# Patient Record
Sex: Male | Born: 1960 | Race: White | Hispanic: No | Marital: Married | Smoking: Never smoker
Health system: Southern US, Community
[De-identification: ages and names within clinical notes are randomized; demographics above are authoritative.]

## PROBLEM LIST (undated history)

## (undated) DIAGNOSIS — K429 Umbilical hernia without obstruction or gangrene: Secondary | ICD-10-CM

## (undated) DIAGNOSIS — Z8709 Personal history of other diseases of the respiratory system: Secondary | ICD-10-CM

## (undated) DIAGNOSIS — Z9189 Other specified personal risk factors, not elsewhere classified: Secondary | ICD-10-CM

## (undated) DIAGNOSIS — K219 Gastro-esophageal reflux disease without esophagitis: Secondary | ICD-10-CM

## (undated) DIAGNOSIS — Z87442 Personal history of urinary calculi: Secondary | ICD-10-CM

## (undated) DIAGNOSIS — N200 Calculus of kidney: Secondary | ICD-10-CM

## (undated) DIAGNOSIS — K4090 Unilateral inguinal hernia, without obstruction or gangrene, not specified as recurrent: Secondary | ICD-10-CM

## (undated) HISTORY — PX: TONSILLECTOMY: SUR1361

## (undated) HISTORY — PX: EXTRACORPOREAL SHOCK WAVE LITHOTRIPSY: SHX1557

---

## 2001-02-22 HISTORY — PX: KNEE ARTHROSCOPY: SUR90

## 2003-09-25 ENCOUNTER — Encounter: Admission: RE | Admit: 2003-09-25 | Discharge: 2003-09-25 | Payer: Self-pay | Admitting: Family Medicine

## 2003-11-20 ENCOUNTER — Encounter: Admission: RE | Admit: 2003-11-20 | Discharge: 2003-11-20 | Payer: Self-pay | Admitting: Otolaryngology

## 2005-08-11 ENCOUNTER — Encounter: Admission: RE | Admit: 2005-08-11 | Discharge: 2005-08-11 | Payer: Self-pay | Admitting: Family Medicine

## 2005-09-01 ENCOUNTER — Encounter: Admission: RE | Admit: 2005-09-01 | Discharge: 2005-09-01 | Payer: Self-pay | Admitting: Specialist

## 2007-06-22 ENCOUNTER — Ambulatory Visit (HOSPITAL_COMMUNITY): Admission: RE | Admit: 2007-06-22 | Discharge: 2007-06-22 | Payer: Self-pay | Admitting: Urology

## 2007-06-22 ENCOUNTER — Encounter: Payer: Self-pay | Admitting: Emergency Medicine

## 2007-06-22 HISTORY — PX: OTHER SURGICAL HISTORY: SHX169

## 2007-06-28 ENCOUNTER — Ambulatory Visit (HOSPITAL_COMMUNITY): Admission: AD | Admit: 2007-06-28 | Discharge: 2007-06-28 | Payer: Self-pay | Admitting: Urology

## 2007-06-28 HISTORY — PX: CYSTOSCOPY W/ URETERAL STENT PLACEMENT: SHX1429

## 2007-07-27 ENCOUNTER — Ambulatory Visit (HOSPITAL_COMMUNITY): Admission: RE | Admit: 2007-07-27 | Discharge: 2007-07-27 | Payer: Self-pay | Admitting: Urology

## 2008-04-26 ENCOUNTER — Encounter: Admission: RE | Admit: 2008-04-26 | Discharge: 2008-04-26 | Payer: Self-pay | Admitting: Otolaryngology

## 2010-03-15 ENCOUNTER — Encounter: Payer: Self-pay | Admitting: Urology

## 2010-07-07 NOTE — Op Note (Signed)
[name], [name]                  ACCOUNT NO.:  [id number]   MEDICAL RECORD NO.:  [id number]          PATIENT TYPE:  AMB   LOCATION:  DAY                          FACILITY:  [facility]   PHYSICIAN:  Lindaann Slough, M.D.  DATE OF BIRTH:  March 08, 1960   DATE OF PROCEDURE:  06/28/2007  DATE OF DISCHARGE:                               OPERATIVE REPORT   PREOPERATIVE DIAGNOSIS:  Left flank pain, status post left ureteral  stone extraction and left renal calculus.   POSTOPERATIVE DIAGNOSIS:  Left flank pain, status post left ureteral  stone extraction and left renal calculus.   PROCEDURE:  Cystoscopy, left retrograde pyelogram and insertion of  double-J stent.   SURGEON:  Danae Chen, M.D.   ANESTHESIA:  General.   INDICATIONS:  The patient is a 50 year old male who has a left ureteral  stone extraction on June 22, 2007.  A double-J stent was left in place  and removed yesterday morning.  Since then he has been having left flank  pain.  The pain was severe this morning and was associated with nausea.  A KUB showed a stone in the left kidney that has remained unchanged  since the CT scan of last week and there is no evidence of ureteral  stone.  However, the patient has continued to complain of pain, was  given IM analgesics that did not relieve the pain.  He is now scheduled  for cystoscopy and retrograde pyelogram and insertion of double-J stent.   DESCRIPTION OF PROCEDURE:  Under general anesthesia the patient was  prepped and draped and placed in the dorsal lithotomy position.  A  panendoscope was inserted in the bladder.  The urethra is normal.  There  is a blood clot at the left ureteral orifice.  There is no stone or  tumor in the bladder.   Retrograde pyelogram:   The Glidewire was passed through the cystoscope and into the left  ureteral orifice and the Glidewire was advanced all the way up into the  renal pelvis.  An open-ended catheter was then passed over the Glidewire  into the distal ureter and the Glidewire was removed.  Contrast was then  injected through the open-ended catheter.  There is some bullous edema  of the left distal ureter with moderate dilation of the distal and mid  ureter.  The proximal ureter and the renal pelvis and collecting system  appear nondilated.   A guidewire was then passed through the open-ended catheter and the open-  ended catheter was removed.  A #6-French - 26 double-J catheter was then  passed over the guidewire.  The proximal curl of the double-J catheter  is in  the renal pelvis.  The distal curl is in the bladder.  The bladder was  then emptied and the cystoscope and guidewire removed.   The patient tolerated the procedure well and left the OR in satisfactory  condition to post anesthesia care unit.      Lindaann Slough, M.D.  Electronically Signed     MN/MEDQ  D:  06/28/2007  T:  06/28/2007  Job:  [id number]

## 2010-07-07 NOTE — Op Note (Signed)
Charles Moran, Charles Moran                  ACCOUNT NO.:  [id number]   MEDICAL RECORD NO.:  [id number]          PATIENT TYPE:  AMB   LOCATION:  DAY                          FACILITY:  [facility]   PHYSICIAN:  Lindaann Slough, M.D.  DATE OF BIRTH:  Jul 09, 1960   DATE OF PROCEDURE:  06/22/2007  DATE OF DISCHARGE:                               OPERATIVE REPORT   PREOPERATIVE DIAGNOSIS:  Left ureteral stone.   POSTOPERATIVE DIAGNOSIS:  Left ureteral stone.   PROCEDURE:  Cystoscopy, left retrograde pyelogram, ureteroscopy, holmium  laser left ureteral stone, stone extraction and insertion of double-J  catheter.   SURGEON:  Danae Chen, M.D.   ANESTHESIA:  General.   INDICATION:  The patient is a 50 years old male who has a history of  kidney stones.  He had sudden onset of severe left flank pain this  morning associated with nausea and vomiting.  A CT scan showed a 3 x 4  mm stone in the left proximal ureter with mild hydronephrosis.  The  patient was treated with pain medication however, he continued to  complain of severe pain.  He is scheduled for cystoscopy, retrograde  pyelogram, stone manipulation and insertion of double-J catheter.   Under general anesthesia the patient was prepped and draped and placed  in the dorsolithotomy position.  A panendoscope was inserted in the  bladder.  The urethra is normal.  The bladder mucosa is normal.  There  is no stone or tumor in the bladder.  The ureteral orifices are in  normal position and shape.   RETROGRADE PYELOGRAM:  A Glidewire was passed through an open-ended  catheter and passed through the cystoscope into the left ureteral  orifice.  The Glidewire was advanced all the way up into the renal  pelvis and the open-ended catheter was advanced over the Glidewire into  the distal ureter.  The Glidewire was removed.  Contrast was then  injected through the open-ended catheter.  There is a filling defect in  the proximal ureter with mild dilation of  the proximal ureter and  collecting system.  A sensor tip guidewire was then passed through the  open-ended catheter and open-ended catheter was removed.  The cystoscope  was then removed.  A number 6.5 French semi rigid ureteroscope was then  passed in the bladder and through the ureteral orifice but could not be  passed through the intramural ureter.  The ureteroscope was then  removed.  The intramural ureter was then dilated with a ureteroscope  access sheath and the ureteroscope access sheath was removed.  The  ureteroscope was then reinserted in the bladder and passed without  difficulty in the ureter and it was advanced into the upper ureter where  the stone was visualized.  With a 365 microfiber holmium laser, the  stone was fragmented in multiple small stone fragments.  The larger  stone fragments were then removed with the nitinol stone basket.   SECOND RETROGRADE PYELOGRAM:  Contrast was injected through the  ureteroscope and there was no evidence of extravasation of contrast.  The ureteroscope was  then removed.   The guidewire was then back loaded into the cystoscope and a #6-French -  26 double-J catheter was passed over the guide wire.  The proximal curl  of the double-J catheter is in the renal pelvis.  The distal curl is in  the bladder.  The string was left attached to the double-J catheter.   Bladder was then emptied and the cystoscope and guidewire removed.   The patient tolerated the procedure well and left the OR in satisfactory  condition to post anesthesia care unit.      Lindaann Slough, M.D.  Electronically Signed     MN/MEDQ  D:  06/22/2007  T:  06/22/2007  Job:  [id number]

## 2010-11-17 LAB — URINALYSIS, ROUTINE W REFLEX MICROSCOPIC
Ketones, ur: 15 — AB
Nitrite: NEGATIVE
Specific Gravity, Urine: 1.018

## 2010-11-17 LAB — BASIC METABOLIC PANEL
CO2: 25
Chloride: 112
Glucose, Bld: 106 — ABNORMAL HIGH
Potassium: 5
Sodium: 140

## 2010-11-17 LAB — URINE MICROSCOPIC-ADD ON

## 2010-11-17 LAB — HEMOGLOBIN AND HEMATOCRIT, BLOOD: HCT: 39.6

## 2013-01-15 ENCOUNTER — Ambulatory Visit (INDEPENDENT_AMBULATORY_CARE_PROVIDER_SITE_OTHER): Payer: 59 | Admitting: Surgery

## 2013-01-17 ENCOUNTER — Encounter (INDEPENDENT_AMBULATORY_CARE_PROVIDER_SITE_OTHER): Payer: Self-pay | Admitting: Surgery

## 2013-10-12 ENCOUNTER — Encounter (INDEPENDENT_AMBULATORY_CARE_PROVIDER_SITE_OTHER): Payer: Self-pay | Admitting: Surgery

## 2013-10-12 ENCOUNTER — Ambulatory Visit (INDEPENDENT_AMBULATORY_CARE_PROVIDER_SITE_OTHER): Payer: 59 | Admitting: Surgery

## 2013-10-12 VITALS — BP 114/70 | HR 86 | Temp 98.4°F | Ht 71.0 in | Wt 210.1 lb

## 2013-10-12 DIAGNOSIS — K429 Umbilical hernia without obstruction or gangrene: Secondary | ICD-10-CM

## 2013-10-12 NOTE — Patient Instructions (Signed)
Umbilical Herniorrhaphy Herniorrhaphy is surgery to repair a hernia. A hernia is the protrusion of a part of an organ through an abdominal opening. An umbilical hernia means that your hernia is in the area around your navel. If the hernia is not repaired, the gap could get bigger. Your intestines or other tissues, such as fat, could get trapped in the gap. This can lead to other health problems, such as blocked intestines. If the hernia is fixed before problems set in, you may be allowed to go home the same day as the surgery (outpatient). LET YOUR HEALTH CARE PROVIDER KNOW ABOUT:  Allergies to food or medicine.  Medicines taken, including vitamins, herbs, eye drops, over-the-counter medicines, and creams.  Use of steroids (by mouth or creams).  Previous problems with anesthetics or numbing medicines.  History of bleeding problems or blood clots.  Previous surgery.  Other health problems, including diabetes and kidney problems.  Possibility of pregnancy, if this applies. RISKS AND COMPLICATIONS  Pain.  Excessive bleeding.  Hematoma. This is a pocket of blood that collects under the surgery site.  Infection at the surgery site.  Numbness at the surgery site.  Swelling and bruising.  Blood clots.  Intestinal damage (rare).  Scarring.  Skin damage.  Development of another hernia. This may require another surgery. BEFORE THE PROCEDURE  Ask your health care provider about changing or stopping your regular medicines. You may need to stop taking aspirin, nonsteroidal anti-inflammatory drugs (NSAIDs), vitamin E, and blood thinners as early as 2 weeks before the procedure.  Do not eat or drink for 8 hours before the procedure, or as directed by your health care provider.  You might be asked to shower or wash with an antibacterial soap before the procedure.  Wear comfortable clothes that will be easy to put on after the procedure. PROCEDURE You will be given an intravenous  (IV) tube. A needle will be inserted in your arm. Medicine will flow directly into your body through this needle. You might be given medicine to help you relax (sedative). You will be given medicine that numbs the area (local anesthetic) or medicine that makes you sleep (general anesthetic). If you have open surgery:  The surgeon will make a cut (incision) in your abdomen.  The gap in the muscle wall will be repaired. The surgeon may sew the edges together over the gap or use a mesh material to strengthen the area. When mesh is used, the body grows new, strong tissue into and around it. This new tissue closes the gap.  A drain might be put in to remove excess fluid from the body after surgery.  The surgeon will close the incision with stitches, glue, or staples. If you have laparoscopic surgery:  The surgeon will make several small incisions in your abdomen.  A thin, lighted tube (laparoscope) will be inserted into the abdomen through an incision. A camera is attached to the laparoscope that allows the surgeon to see inside the abdomen.  Tools will be inserted through the other incisions to repair the hernia. Usually, mesh is used to cover the gap.  The surgeon will close the incisions with stitches. AFTER THE PROCEDURE  You will be taken to a recovery area. A nurse will watch and check your progress.  When you are awake, feeling well, and taking fluids well, you may be allowed to go home. In some cases, you may need to stay overnight in the hospital.  Arrange for someone to drive you home.   Document Released: 05/07/2008 Document Revised: 06/25/2013 Document Reviewed: 05/12/2011 [employer] Patient Information 2015 [employer]. This information is not intended to replace advice given to you by your health care provider. Make sure you discuss any questions you have with your health care provider.  

## 2013-10-12 NOTE — Progress Notes (Signed)
Chief Complaint:  Symptomatic umbilical hernia also with a 10 year history of a left inguinal hernia  History of Present Illness:  Charles Moran is an 53 y.o. male who is referred by Dr. Marjory LiesBrent Burnett with a symptomatic umbilical hernia. This has gotten bigger over the last several months and bothers him more. There is no history of incarceration. He also has had a left inguinal hernia present for about 10 years. I discussed laparoscopic repair of the umbilical hernia since this is the one that is bothering him and he feels very comfortable dishwashing left inguinal hernia. I described the procedure in some detail and including its risks and benefits. A given a booklet on hernia repairs. He wants to wait until after December to have this repaired.  History reviewed. No pertinent past medical history.  Past Surgical History  Procedure Laterality Date  . Knee surgery      Current Outpatient Prescriptions  Medication Sig Dispense Refill  . cetirizine (ZYRTEC) 10 MG tablet Take 10 mg by mouth daily.       No current facility-administered medications for this visit.   Review of patient's allergies indicates no known allergies. Family History  Problem Relation Age of Onset  . Cancer Father    Social History:   reports that he has never smoked. He does not have any smokeless tobacco history on file. He reports that he drinks alcohol. He reports that he does not use illicit drugs.   REVIEW OF SYSTEMS : Negative except for see above  Physical Exam:   Blood pressure 114/70, pulse 86, temperature 98.4 F (36.9 C), temperature source Oral, height 5\' 11"  (1.803 m), weight 210 lb 2 oz (95.312 kg). Body mass index is 29.32 kg/(m^2).  Gen:  WDWN white male NAD  Neurological: Alert and oriented to person, place, and time. Motor and sensory function is grossly intact  Head: Normocephalic and atraumatic.  Eyes: Conjunctivae are normal. Pupils are equal, round, and reactive to light. No scleral icterus.   Neck: Normal range of motion. Neck supple. No tracheal deviation or thyromegaly present.  Cardiovascular:  SR without murmurs or gallops.  No carotid bruits Breast:  Not examined Respiratory: Effort normal.  No respiratory distress. No chest wall tenderness. Breath sounds normal.  No wheezes, rales or rhonchi.  Abdomen:  Prominent umbilical hernia protruding. Left inguinal hernia easily reducible and not entering into the scrotum. GU:  As above Musculoskeletal: Normal range of motion. Extremities are nontender. No cyanosis, edema or clubbing noted Lymphadenopathy: No cervical, preauricular, postauricular or axillary adenopathy is present Skin: Skin is warm and dry. No rash noted. No diaphoresis. No erythema. No pallor. Pscyh: Normal mood and affect. Behavior is normal. Judgment and thought content normal.   LABORATORY RESULTS: No results found for this or any previous visit (from the past 48 hour(s)).   RADIOLOGY RESULTS: No results found.  Problem List: There are no active problems to display for this patient.   Assessment & Plan: Symptomatic umbilical hernia. We'll schedule elective laparoscopic umbilical hernia repair in December as per patient request.    Matt B. Daphine DeutscherMartin, MD, [facility]  [facility] [phone] beeper [phone]  10/12/2013 5:36 PM

## 2013-10-16 ENCOUNTER — Encounter: Payer: Self-pay | Admitting: *Deleted

## 2013-10-17 ENCOUNTER — Encounter: Payer: Self-pay | Admitting: Neurology

## 2013-10-17 ENCOUNTER — Ambulatory Visit (INDEPENDENT_AMBULATORY_CARE_PROVIDER_SITE_OTHER): Payer: 59 | Admitting: Neurology

## 2013-10-17 VITALS — BP 115/72 | HR 72 | Ht 71.0 in | Wt 215.2 lb

## 2013-10-17 DIAGNOSIS — R202 Paresthesia of skin: Secondary | ICD-10-CM

## 2013-10-17 DIAGNOSIS — R7309 Other abnormal glucose: Secondary | ICD-10-CM

## 2013-10-17 DIAGNOSIS — M5412 Radiculopathy, cervical region: Secondary | ICD-10-CM

## 2013-10-17 DIAGNOSIS — G43909 Migraine, unspecified, not intractable, without status migrainosus: Secondary | ICD-10-CM

## 2013-10-17 DIAGNOSIS — R292 Abnormal reflex: Secondary | ICD-10-CM

## 2013-10-17 DIAGNOSIS — M5010 Cervical disc disorder with radiculopathy, unspecified cervical region: Secondary | ICD-10-CM

## 2013-10-17 DIAGNOSIS — R209 Unspecified disturbances of skin sensation: Secondary | ICD-10-CM

## 2013-10-17 NOTE — Patient Instructions (Signed)
Overall you are doing fairly well but I do want to suggest a few things today:   Remember to drink plenty of fluid, eat healthy meals and do not skip any meals. Try to eat protein with a every meal and eat a healthy snack such as fruit or nuts in between meals. Try to keep a regular sleep-wake schedule and try to exercise daily, particularly in the form of walking, 20-30 minutes a day, if you can.   As far as diagnostic testing: blood work today, MRI of your neck and upper back. Will consider further testing after results.   I would like to see you back in 3 months, sooner if we need to. Please call us with any interim questions, concerns, problems, updates or refill requests.   Please also call us for any test results so we can go over those with you on the phone.  My clinical assistant and will answer any of your questions and relay your messages to me and also relay most of my messages to you.   Our phone number is [phone]. We also have an after hours call service for urgent matters and there is a physician on-call for urgent questions. For any emergencies you know to call 911 or go to the nearest emergency room

## 2013-10-17 NOTE — Progress Notes (Addendum)
[facility]    Provider:  Dr Lucia Gaskins Referring Provider: Delorse Lek, MD Primary Care Physician:  No primary provider on file.  CC:  Left-sided numbness  HPI:  Charles Moran is a 53 y.o. male here as a referral from Dr. Doristine Counter for left-sided numbness  53 year old male with a PMHx of HTN, headache, myalgia, chronic sinusitis who is here for evaluation of left-sided numbness. The numbness is in the entire left side of the body: the face, tongue, lips, arm,leg torso. It goes right down the middle in the mouth and tongue and lip it also splits in the middle. No bad taste in the mouth, no sour tasting, no drooling from the left side of the mouth. Started approx 6 weeks ago. Doesn't remember where he was when he noticed it. Symptoms make him feel uncomfortable, no pain. Like when your arm goes to sleep, that's the way his body feels. No neck pain. Left side a little weaker with these episodes. Never had left sided weakness with the migraines. No other vision changes or other focal neurologic episodes. Both arms fall asleep at night. No numbness or tingling in the toes. No falls or any trauma or any inciting factors No headaches. Numbness is daily sometimes 5x a day, to 30 seconds. No headache association but does have a pmhx of migraines. Sometimes sees spots, like bubbles, out of the left side of his vision. Has had migraines for years since childhood but have improved since then. Headache yesterday but was more of a pressure headache. Migraine symptoms are throbbing left side of the face and top of the head +nausea, +vomiting. No autonomic symptoms. Never smoked. 1 beer a month. No toxin exposure. Current ymptoms may get worse in the heat. No rhyme or reasons to symptoms.   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   Review of Systems: Patient complains of symptoms per HPI as well as the following symptoms ringing in ears, dizziness. Pertinent negatives per HPI. Otherwise out of a complete 14 system review, and all other reviewed systems are negative.   History   Social History  . Marital Status: Married    Spouse Name: N/A    Number of Children: N/A  . Years of Education: N/A   Occupational History  . Not on file.   Social History Main Topics  . Smoking status: Never Smoker   . Smokeless tobacco: Not on file  . Alcohol Use: Yes  . Drug Use: No  . Sexual Activity: Not on file   Other Topics Concern  . Not on file   Social History Narrative  . No narrative on file    Family History  Problem Relation Age of Onset  . Cancer Father     Past Medical History  Diagnosis Date  . Elevated blood pressure reading without diagnosis of hypertension   . Headache   . Inguinal hernia without mention of obstruction or gangrene, unilateral or unspecified, (not specified as recurrent)   . Medial epicondylitis of elbow   . Myalgia and myositis, unspecified   . Unspecified sinusitis (chronic)   . Dermatophytosis of groin and perianal area   . Umbilical hernia without mention of obstruction or gangrene     Past Surgical History  Procedure Laterality Date  .  Knee surgery    . Tonsillectomy      Current Outpatient Prescriptions  Medication Sig Dispense Refill  . cetirizine (ZYRTEC) 10 MG tablet Take 10 mg by mouth daily.       No current facility-administered medications for this visit.    Allergies as of 10/17/2013  . (No Known Allergies)    Vitals: There were no vitals taken for this visit. Last Weight:  Wt Readings from Last 1 Encounters:  10/12/13 210 lb 2 oz (95.312 kg)   Last Height:   Ht Readings from Last 1 Encounters:  10/12/13  (1.803 m)     Physical exam: Exam: Gen: NAD,  conversant Eyes: anicteric sclerae, moist conjunctivae HENT: Atraumatic, oropharynx clear Neck: Trachea midline; supple,  Lungs: CTA, no wheezing, rales, rhonic                          CV: RRR, no MRG Abdomen: Soft, non-tender;  Extremities: No peripheral edema  Skin: Normal temperature,hypopigmentation on the arms Psych: Appropriate affect, pleasant    Neuro: Detailed Neurologic Exam  Speech:    Speech is normal; fluent and spontaneous with normal comprehension.  Cognition:    The patient is oriented to person, place, and time; memory intact; language fluent; normal attention, concentration, and fund of knowledge.  Cranial Nerves:    The pupils are equal, round, and reactive to light. The fundi are normal and spontaneous venous pulsations are present. Visual fields are full to finger confrontation. Extraocular movements are intact. Left hemisensory loss with splitting down the middle.  The face is symmetric. The palate elevates in the midline. Voice is normal. Shoulder shrug is normal. The tongue has normal motion without fasciculations.   Coordination:    Normal finger to nose and heel to shin.  Gait:    Heel-toe and tandem gait are normal.   Motor Observation:    No asymmetry, no atrophy, and no involuntary movements noted. Tone:    Normal muscle tone.Marland Kitchen    Posture:    Posture is normal. normal erect   Strength:    Strength is V/V in the upper and lower limbs.         Vibratory Sensation:    Normal vibratory sensation in upper and lower extremities.    Proprioception:    Normal proprioception in upper and lower extremities.   Pin Prick: Impaired lateral border of right leg. Left decrease distally in the lower extremity  Left T2 sensory level    Temperature: Impaired lateral border of right leg. Left decrease distally in the lower extremity     Reflex Exam:  DTR's:    Deep tendon reflexes in the upper and lower extremities are normal bilaterally.    Toes:   upgoing left Clonus:    Clonus is absent.  Other Reflexes: Negative glabellar      Assessment:  53 year old male with a PMHx of HTN, headache, myalgia, chronic sinusitis who is here for evaluation of left-sided numbness. The numbness is in the entire left side of the body: the face, tongue, lips, arm,leg torso. It goes right down the middle in the mouth and tongue and lip it also splits in the middle. Started approx 6 weeks ago. No neck pain. Nu current headache. No other vision changes or other focal neurologic episodes. 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   Neuro exam is unusual. Patient numbness of the face splits in the midline. And he appears to have a left T2 sensory level, upgoing left toe, and inconsistent lower left leg sensory findings. SYmptoms may be due to a migraine disorder because although he is not currently having migraines, he does report aura symptoms and a long history of migraines. But need to rule out other etiologies  T2 sensory level: Will order an MRI of the cervical spine and thoracic spine to ensure no cord lesion such as transverse myelitis causing sensory issues Paresthesias - Will order serum neuropathy panel Chronic microvascular disease - Discussed management of any vascular risks such as HTN, diabetes, hyperlipidemia and regular follow up with primary care physician.  Suggest follow up with optometrist for vision check and dilated fundoscopic exam    Naomie Dean, MD  [facility] [address] [place], [place] [address]  Phone [phone] Fax [phone] Lesly Dukes

## 2013-10-18 DIAGNOSIS — R292 Abnormal reflex: Secondary | ICD-10-CM | POA: Insufficient documentation

## 2013-10-18 DIAGNOSIS — G43909 Migraine, unspecified, not intractable, without status migrainosus: Secondary | ICD-10-CM | POA: Insufficient documentation

## 2013-10-18 DIAGNOSIS — M5010 Cervical disc disorder with radiculopathy, unspecified cervical region: Secondary | ICD-10-CM | POA: Insufficient documentation

## 2013-10-18 DIAGNOSIS — R202 Paresthesia of skin: Secondary | ICD-10-CM | POA: Insufficient documentation

## 2013-10-24 ENCOUNTER — Ambulatory Visit (INDEPENDENT_AMBULATORY_CARE_PROVIDER_SITE_OTHER): Payer: 59

## 2013-10-24 DIAGNOSIS — R292 Abnormal reflex: Secondary | ICD-10-CM

## 2013-10-24 DIAGNOSIS — M5412 Radiculopathy, cervical region: Secondary | ICD-10-CM

## 2013-10-24 DIAGNOSIS — R209 Unspecified disturbances of skin sensation: Secondary | ICD-10-CM

## 2013-10-24 DIAGNOSIS — R202 Paresthesia of skin: Secondary | ICD-10-CM

## 2013-10-24 MED ORDER — GADOPENTETATE DIMEGLUMINE 469.01 MG/ML IV SOLN: 20.0000 mL | Freq: Once | INTRAVENOUS | Status: AC | PRN

## 2013-10-26 LAB — COMPREHENSIVE METABOLIC PANEL
ALT: 11 IU/L (ref 0–44)
AST: 13 IU/L (ref 0–40)
Albumin/Globulin Ratio: 1.8 (ref 1.1–2.5)
Albumin: 4.3 g/dL (ref 3.5–5.5)
Alkaline Phosphatase: 67 IU/L (ref 39–117)
BILIRUBIN TOTAL: 1.8 mg/dL — AB (ref 0.0–1.2)
BUN/Creatinine Ratio: 16 (ref 9–20)
BUN: 20 mg/dL (ref 6–24)
CALCIUM: 9.5 mg/dL (ref 8.7–10.2)
CO2: 19 mmol/L (ref 18–29)
CREATININE: 1.25 mg/dL (ref 0.76–1.27)
Chloride: 104 mmol/L (ref 97–108)
GFR calc non Af Amer: 65 mL/min/{1.73_m2} (ref >59)
GFR, EST AFRICAN AMERICAN: 76 mL/min/{1.73_m2} (ref >59)
GLOBULIN, TOTAL: 2.4 g/dL (ref 1.5–4.5)
Glucose: 88 mg/dL (ref 65–99)
Potassium: 4.3 mmol/L (ref 3.5–5.2)
SODIUM: 140 mmol/L (ref 134–144)
Total Protein: 6.7 g/dL (ref 6.0–8.5)

## 2013-10-26 LAB — ANTINEUTROPHIL CYTOPLASMIC AB

## 2013-10-26 LAB — HEMOGLOBIN A1C
Est. average glucose Bld gHb Est-mCnc: 111 mg/dL
Hgb A1c MFr Bld: 5.5 % (ref 4.8–5.6)

## 2013-10-26 LAB — IFE AND PE, SERUM
ALPHA2 GLOB SERPL ELPH-MCNC: 0.8 g/dL (ref 0.4–1.2)
Albumin SerPl Elph-Mcnc: 3.7 g/dL (ref 3.2–5.6)
Albumin/Glob SerPl: 1.3 (ref 0.7–2.0)
Alpha 1: 0.2 g/dL (ref 0.1–0.4)
B-GLOBULIN SERPL ELPH-MCNC: 1.2 g/dL (ref 0.6–1.3)
GLOBULIN, TOTAL: 3 g/dL (ref 2.0–4.5)
Gamma Glob SerPl Elph-Mcnc: 0.8 g/dL (ref 0.5–1.6)
IGA/IMMUNOGLOBULIN A, SERUM: 284 mg/dL (ref 91–414)
IGG (IMMUNOGLOBIN G), SERUM: 787 mg/dL (ref 700–1600)
IgM (Immunoglobulin M), Srm: 126 mg/dL (ref 40–230)

## 2013-10-26 LAB — RPR: SYPHILIS RPR SCR: NONREACTIVE

## 2013-10-26 LAB — CRYOGLOBULIN, QL, SERUM, RFLX

## 2013-10-26 LAB — TSH: TSH: 1.75 u[IU]/mL (ref 0.450–4.500)

## 2013-10-26 LAB — LYME, TOTAL AB TEST/REFLEX

## 2013-10-26 LAB — SEDIMENTATION RATE: Sed Rate: 5 mm/hr (ref 0–30)

## 2013-10-26 LAB — HIV ANTIBODY (ROUTINE TESTING W REFLEX)
HIV 1/O/2 Abs-Index Value: <1 (ref <1.00)
HIV-1/HIV-2 Ab: NONREACTIVE

## 2013-10-26 LAB — ANTIMYELOPEROXIDASE (MPO) ABS

## 2013-10-26 LAB — VITAMIN B12: VITAMIN B 12: 384 pg/mL (ref 211–946)

## 2013-10-26 LAB — FOLATE: FOLATE: 12.9 ng/mL (ref >3.0)

## 2013-10-26 LAB — ANA W/REFLEX: Anti Nuclear Antibody(ANA): NEGATIVE

## 2013-10-26 LAB — VITAMIN B1, WHOLE BLOOD

## 2013-10-26 LAB — RHEUMATOID FACTOR: RHEUMATOID FACTOR: 9.5 [IU]/mL (ref 0.0–13.9)

## 2013-10-31 NOTE — Progress Notes (Signed)
Quick Note:  Called patient, left message of normal labs and MRI with some mild arthritic changes but nothing of significance, call the office back with any questions or concerns. ______

## 2014-02-04 ENCOUNTER — Encounter (HOSPITAL_BASED_OUTPATIENT_CLINIC_OR_DEPARTMENT_OTHER): Payer: Self-pay | Admitting: *Deleted

## 2014-02-08 ENCOUNTER — Encounter (HOSPITAL_BASED_OUTPATIENT_CLINIC_OR_DEPARTMENT_OTHER): Admission: RE | Payer: Self-pay | Source: Ambulatory Visit

## 2014-02-08 ENCOUNTER — Ambulatory Visit (HOSPITAL_BASED_OUTPATIENT_CLINIC_OR_DEPARTMENT_OTHER): Admission: RE | Admit: 2014-02-08 | Payer: 59 | Source: Ambulatory Visit | Admitting: Surgery

## 2014-02-08 HISTORY — DX: Personal history of urinary calculi: Z87.442

## 2014-02-08 HISTORY — DX: Personal history of other diseases of the respiratory system: Z87.09

## 2014-02-08 HISTORY — DX: Unilateral inguinal hernia, without obstruction or gangrene, not specified as recurrent: K40.90

## 2014-02-08 HISTORY — DX: Umbilical hernia without obstruction or gangrene: K42.9

## 2014-02-08 SURGERY — REPAIR, HERNIA, UMBILICAL, LAPAROSCOPIC
Anesthesia: General

## 2014-05-02 NOTE — Progress Notes (Signed)
Need pre-orders for pt on Tuesday 05-07-2014, Laparoscopic umbilical hernia repair.

## 2014-05-03 ENCOUNTER — Encounter (HOSPITAL_BASED_OUTPATIENT_CLINIC_OR_DEPARTMENT_OTHER): Payer: Self-pay | Admitting: *Deleted

## 2014-05-03 NOTE — Progress Notes (Signed)
NPO AFTER MN WITH EXCEPTION CLEAR LIQUIDS UNTIL 0700 (NO CREAM/ MILK PRODUCTS).  ARRIVE AT 1115. NEEDS HG.   PRE-OP ORDERS PENDING, CALL OFFICE YESTERDAY.

## 2014-05-03 NOTE — Progress Notes (Signed)
   05/03/14 1324  OBSTRUCTIVE SLEEP APNEA  Have you ever been diagnosed with sleep apnea through a sleep study? No  Do you snore loudly (loud enough to be heard through closed doors)?  1  Do you often feel tired, fatigued, or sleepy during the daytime? 0  Has anyone observed you stop breathing during your sleep? 0  Do you have, or are you being treated for high blood pressure? 0  BMI more than 35 kg/m2? 0  Age over 54 years old? 1  Neck circumference greater than 40 cm/16 inches? 1  Gender: 1  Obstructive Sleep Apnea Score 4

## 2014-05-06 ENCOUNTER — Ambulatory Visit (INDEPENDENT_AMBULATORY_CARE_PROVIDER_SITE_OTHER): Payer: Self-pay | Admitting: Orthopedic Surgery

## 2014-05-06 NOTE — H&P (Signed)
Chief Complaint: Symptomatic umbilical hernia also with a 10 year history of a left inguinal hernia  History of Present Illness: Charles Moran is an 54 y.o. male who is referred by Dr. Marjory LiesBrent Burnett with a symptomatic umbilical hernia. This has gotten bigger over the last several months and bothers him more. There is no history of incarceration. He also has had a left inguinal hernia present for about 10 years. I discussed laparoscopic repair of the umbilical hernia since this is the one that is bothering him and he feels very comfortable watching the  left inguinal hernia. I described the procedure in some detail and including its risks and benefits. A given a booklet on hernia repairs. History reviewed. No pertinent past medical history.  Past Surgical History  Procedure Laterality Date  . Knee surgery      Current Outpatient Prescriptions  Medication Sig Dispense Refill  . cetirizine (ZYRTEC) 10 MG tablet Take 10 mg by mouth daily.     No current facility-administered medications for this visit.   Review of patient's allergies indicates no known allergies. Family History  Problem Relation Age of Onset  . Cancer Father    Social History:  reports that he has never smoked. He does not have any smokeless tobacco history on file. He reports that he drinks alcohol. He reports that he does not use illicit drugs.   REVIEW OF SYSTEMS : Negative except for see above  Physical Exam:  Blood pressure 114/70, pulse 86, temperature 98.4 F (36.9 C), temperature source Oral, height 5\' 11"  (1.803 m), weight 210 lb 2 oz (95.312 kg). Body mass index is 29.32 kg/(m^2).  Gen: WDWN white male NAD  Neurological: Alert and oriented to person, place, and time. Motor and sensory function is grossly intact  Head: Normocephalic and atraumatic.  Eyes: Conjunctivae are normal. Pupils are equal, round, and reactive to light. No scleral icterus.  Neck: Normal range of  motion. Neck supple. No tracheal deviation or thyromegaly present.  Cardiovascular: SR without murmurs or gallops. No carotid bruits Breast: Not examined Respiratory: Effort normal. No respiratory distress. No chest wall tenderness. Breath sounds normal. No wheezes, rales or rhonchi.  Abdomen: Prominent umbilical hernia protruding. Left inguinal hernia easily reducible and not entering into the scrotum. GU: As above Musculoskeletal: Normal range of motion. Extremities are nontender. No cyanosis, edema or clubbing noted Lymphadenopathy: No cervical, preauricular, postauricular or axillary adenopathy is present Skin: Skin is warm and dry. No rash noted. No diaphoresis. No erythema. No pallor. Pscyh: Normal mood and affect. Behavior is normal. Judgment and thought content normal.   LABORATORY RESULTS:  Lab Results Last 48 Hours    No results found for this or any previous visit (from the past 48 hour(s)).     RADIOLOGY RESULTS:  Imaging Results (Last 48 hours)    No results found.    Problem List: Left inguinal hernia Umbilical hernia   Assessment & Plan: Symptomatic umbilical hernia. Laparoscopic umbilical hernia repair.     Matt B. Daphine DeutscherMartin, MD, [facility]  [facility] [phone] beeper [phone]

## 2014-05-07 ENCOUNTER — Encounter (HOSPITAL_BASED_OUTPATIENT_CLINIC_OR_DEPARTMENT_OTHER)
Admission: RE | Disposition: A | Discharge status: Home / Self Care | Payer: Self-pay | Source: Ambulatory Visit | Attending: Surgery

## 2014-05-07 ENCOUNTER — Ambulatory Visit (HOSPITAL_BASED_OUTPATIENT_CLINIC_OR_DEPARTMENT_OTHER)
Admission: RE | Admit: 2014-05-07 | Discharge: 2014-05-07 | Disposition: A | Discharge status: Home / Self Care | Payer: 59 | Source: Ambulatory Visit | Attending: Surgery | Admitting: Surgery

## 2014-05-07 ENCOUNTER — Ambulatory Visit (HOSPITAL_BASED_OUTPATIENT_CLINIC_OR_DEPARTMENT_OTHER): Payer: 59 | Admitting: Anesthesiology

## 2014-05-07 ENCOUNTER — Encounter (HOSPITAL_BASED_OUTPATIENT_CLINIC_OR_DEPARTMENT_OTHER): Payer: Self-pay | Admitting: Anesthesiology

## 2014-05-07 DIAGNOSIS — K429 Umbilical hernia without obstruction or gangrene: Secondary | ICD-10-CM | POA: Diagnosis present

## 2014-05-07 DIAGNOSIS — Z79899 Other long term (current) drug therapy: Secondary | ICD-10-CM | POA: Diagnosis not present

## 2014-05-07 HISTORY — DX: Other specified personal risk factors, not elsewhere classified: Z91.89

## 2014-05-07 HISTORY — DX: Calculus of kidney: N20.0

## 2014-05-07 HISTORY — DX: Gastro-esophageal reflux disease without esophagitis: K21.9

## 2014-05-07 HISTORY — PX: UMBILICAL HERNIA REPAIR: SHX196

## 2014-05-07 LAB — COMPREHENSIVE METABOLIC PANEL
ALT: 21 U/L (ref 0–53)
AST: 17 U/L (ref 0–37)
Albumin: 3.6 g/dL (ref 3.5–5.2)
Alkaline Phosphatase: 59 U/L (ref 39–117)
Anion gap: 1 — ABNORMAL LOW (ref 5–15)
BUN: 16 mg/dL (ref 6–23)
CALCIUM: 8.6 mg/dL (ref 8.4–10.5)
CO2: 27 mmol/L (ref 19–32)
CREATININE: 1.37 mg/dL — AB (ref 0.50–1.35)
Chloride: 110 mmol/L (ref 96–112)
GFR, EST AFRICAN AMERICAN: 67 mL/min — AB (ref >90)
GFR, EST NON AFRICAN AMERICAN: 57 mL/min — AB (ref >90)
GLUCOSE: 109 mg/dL — AB (ref 70–99)
Potassium: 4.5 mmol/L (ref 3.5–5.1)
Sodium: 138 mmol/L (ref 135–145)
Total Bilirubin: 2.1 mg/dL — ABNORMAL HIGH (ref 0.3–1.2)
Total Protein: 6.5 g/dL (ref 6.0–8.3)

## 2014-05-07 LAB — POCT HEMOGLOBIN-HEMACUE
HEMOGLOBIN: 15.9 g/dL (ref 13.0–17.0)
Hemoglobin: 4.6 g/dL — CL (ref 13.0–17.0)

## 2014-05-07 SURGERY — REPAIR, HERNIA, UMBILICAL, LAPAROSCOPIC
Anesthesia: General | Site: Abdomen

## 2014-05-07 MED ORDER — GLYCOPYRROLATE 0.2 MG/ML IJ SOLN
INTRAMUSCULAR | Status: DC | PRN
  Administered 2014-05-07: 0.4 mg via INTRAVENOUS
  Administered 2014-05-07: 0.2 mg via INTRAVENOUS

## 2014-05-07 MED ORDER — DEXTROSE 5 % IV SOLN
2.0000 g | INTRAVENOUS | Status: AC
  Administered 2014-05-07: 2 g via INTRAVENOUS
  Filled 2014-05-07: qty 2

## 2014-05-07 MED ORDER — CEFOXITIN SODIUM 2 G IV SOLR
INTRAVENOUS | Status: AC
  Filled 2014-05-07: qty 2

## 2014-05-07 MED ORDER — ACETAMINOPHEN 10 MG/ML IV SOLN
INTRAVENOUS | Status: DC | PRN
  Administered 2014-05-07: 1000 mg via INTRAVENOUS

## 2014-05-07 MED ORDER — PROMETHAZINE HCL 25 MG/ML IJ SOLN
6.2500 mg | INTRAMUSCULAR | Status: DC | PRN
  Filled 2014-05-07: qty 1

## 2014-05-07 MED ORDER — METOCLOPRAMIDE HCL 5 MG/ML IJ SOLN
INTRAMUSCULAR | Status: DC | PRN
  Administered 2014-05-07: 10 mg via INTRAVENOUS

## 2014-05-07 MED ORDER — DEXAMETHASONE SODIUM PHOSPHATE 4 MG/ML IJ SOLN
INTRAMUSCULAR | Status: DC | PRN
  Administered 2014-05-07: 10 mg via INTRAVENOUS

## 2014-05-07 MED ORDER — SODIUM CHLORIDE 0.9 % IJ SOLN
3.0000 mL | INTRAMUSCULAR | Status: DC | PRN
  Filled 2014-05-07: qty 3

## 2014-05-07 MED ORDER — MIDAZOLAM HCL 2 MG/2ML IJ SOLN
INTRAMUSCULAR | Status: AC
  Filled 2014-05-07: qty 2

## 2014-05-07 MED ORDER — FENTANYL CITRATE 0.05 MG/ML IJ SOLN
INTRAMUSCULAR | Status: AC
  Filled 2014-05-07: qty 2

## 2014-05-07 MED ORDER — KETOROLAC TROMETHAMINE 30 MG/ML IJ SOLN
30.0000 mg | Freq: Once | INTRAMUSCULAR | Status: DC | PRN
  Filled 2014-05-07: qty 1

## 2014-05-07 MED ORDER — LACTATED RINGERS IR SOLN
Status: DC | PRN
  Administered 2014-05-07: 3000 mL

## 2014-05-07 MED ORDER — HEPARIN SODIUM (PORCINE) 5000 UNIT/ML IJ SOLN
INTRAMUSCULAR | Status: AC
  Filled 2014-05-07: qty 1

## 2014-05-07 MED ORDER — LIDOCAINE HCL (CARDIAC) 20 MG/ML IV SOLN
INTRAVENOUS | Status: DC | PRN
  Administered 2014-05-07: 100 mg via INTRAVENOUS

## 2014-05-07 MED ORDER — ACETAMINOPHEN 325 MG PO TABS
650.0000 mg | ORAL_TABLET | ORAL | Status: DC | PRN
  Filled 2014-05-07: qty 2

## 2014-05-07 MED ORDER — NEOSTIGMINE METHYLSULFATE 10 MG/10ML IV SOLN
INTRAVENOUS | Status: DC | PRN
  Administered 2014-05-07: 4 mg via INTRAVENOUS

## 2014-05-07 MED ORDER — PROPOFOL 10 MG/ML IV BOLUS
INTRAVENOUS | Status: DC | PRN
  Administered 2014-05-07: 180 mg via INTRAVENOUS

## 2014-05-07 MED ORDER — OXYCODONE HCL 5 MG PO TABS
5.0000 mg | ORAL_TABLET | ORAL | Status: DC | PRN
  Administered 2014-05-07: 5 mg via ORAL
  Filled 2014-05-07: qty 2

## 2014-05-07 MED ORDER — ROCURONIUM BROMIDE 100 MG/10ML IV SOLN
INTRAVENOUS | Status: DC | PRN
  Administered 2014-05-07: 20 mg via INTRAVENOUS
  Administered 2014-05-07: 10 mg via INTRAVENOUS
  Administered 2014-05-07: 20 mg via INTRAVENOUS

## 2014-05-07 MED ORDER — ONDANSETRON HCL 4 MG/2ML IJ SOLN
INTRAMUSCULAR | Status: DC | PRN
  Administered 2014-05-07: 4 mg via INTRAVENOUS

## 2014-05-07 MED ORDER — FENTANYL CITRATE 0.05 MG/ML IJ SOLN
INTRAMUSCULAR | Status: AC
  Filled 2014-05-07: qty 4

## 2014-05-07 MED ORDER — MIDAZOLAM HCL 5 MG/5ML IJ SOLN
INTRAMUSCULAR | Status: DC | PRN
  Administered 2014-05-07: 2 mg via INTRAVENOUS

## 2014-05-07 MED ORDER — LACTATED RINGERS IV SOLN
INTRAVENOUS | Status: DC
  Administered 2014-05-07 (×2): via INTRAVENOUS
  Filled 2014-05-07: qty 1000

## 2014-05-07 MED ORDER — SODIUM CHLORIDE 0.9 % IR SOLN
Status: DC | PRN
  Administered 2014-05-07: 500 mL

## 2014-05-07 MED ORDER — SODIUM CHLORIDE 0.9 % IV SOLN
250.0000 mL | INTRAVENOUS | Status: DC | PRN
  Filled 2014-05-07: qty 250

## 2014-05-07 MED ORDER — OXYCODONE HCL 5 MG PO TABS
ORAL_TABLET | ORAL | Status: AC
  Filled 2014-05-07: qty 1

## 2014-05-07 MED ORDER — SUCCINYLCHOLINE CHLORIDE 20 MG/ML IJ SOLN
INTRAMUSCULAR | Status: DC | PRN
  Administered 2014-05-07: 100 mg via INTRAVENOUS

## 2014-05-07 MED ORDER — FENTANYL CITRATE 0.05 MG/ML IJ SOLN
25.0000 ug | INTRAMUSCULAR | Status: DC | PRN
  Administered 2014-05-07 (×2): 25 ug via INTRAVENOUS
  Filled 2014-05-07: qty 1

## 2014-05-07 MED ORDER — SODIUM CHLORIDE 0.9 % IJ SOLN
3.0000 mL | Freq: Two times a day (BID) | INTRAMUSCULAR | Status: DC
  Filled 2014-05-07: qty 3

## 2014-05-07 MED ORDER — ACETAMINOPHEN 650 MG RE SUPP
650.0000 mg | RECTAL | Status: DC | PRN
  Filled 2014-05-07: qty 1

## 2014-05-07 MED ORDER — FENTANYL CITRATE 0.05 MG/ML IJ SOLN
INTRAMUSCULAR | Status: DC | PRN
  Administered 2014-05-07 (×3): 25 ug via INTRAVENOUS
  Administered 2014-05-07: 100 ug via INTRAVENOUS
  Administered 2014-05-07: 50 ug via INTRAVENOUS
  Administered 2014-05-07: 25 ug via INTRAVENOUS
  Administered 2014-05-07: 50 ug via INTRAVENOUS

## 2014-05-07 MED ORDER — HEPARIN SODIUM (PORCINE) 5000 UNIT/ML IJ SOLN
5000.0000 [IU] | Freq: Once | INTRAMUSCULAR | Status: AC
  Administered 2014-05-07: 5000 [IU] via SUBCUTANEOUS
  Filled 2014-05-07: qty 1

## 2014-05-07 MED ORDER — BUPIVACAINE LIPOSOME 1.3 % IJ SUSP
INTRAMUSCULAR | Status: DC | PRN
  Administered 2014-05-07: 20 mL

## 2014-05-07 MED ORDER — OXYCODONE HCL 5 MG PO TABS: 5.0000 mg | ORAL_TABLET | ORAL | Status: AC | PRN

## 2014-05-07 SURGICAL SUPPLY — 43 items
BALL CTTN LRG ABS STRL LF (GAUZE/BANDAGES/DRESSINGS) ×1
BINDER ABDOMINAL 10 UNV 27-48 (MISCELLANEOUS) ×3 IMPLANT
CABLE HIGH FREQUENCY MONO STRZ (ELECTRODE) ×3 IMPLANT
COTTONBALL LRG STERILE PKG (GAUZE/BANDAGES/DRESSINGS) ×3 IMPLANT
COVER LIGHT HANDLE  1/PK (MISCELLANEOUS) ×4
COVER LIGHT HANDLE 1/PK (MISCELLANEOUS) ×2 IMPLANT
DEVICE RELIATACK FIXATION (MISCELLANEOUS) ×3 IMPLANT
DEVICE TROCAR PUNCTURE CLOSURE (ENDOMECHANICALS) ×3 IMPLANT
DRAPE CAMERA CLOSED 9X96 (DRAPES) ×3 IMPLANT
DRAPE LAPAROSCOPIC ABDOMINAL (DRAPES) ×3 IMPLANT
ELECT REM PT RETURN 9FT ADLT (ELECTROSURGICAL) ×3
ELECTRODE REM PT RTRN 9FT ADLT (ELECTROSURGICAL) ×1 IMPLANT
GLOVE BIO SURGEON STRL SZ 6.5 (GLOVE) ×4 IMPLANT
GLOVE BIO SURGEONS STRL SZ 6.5 (GLOVE) ×2
GLOVE BIOGEL M 8.0 STRL (GLOVE) ×3 IMPLANT
GLOVE BIOGEL PI IND STRL 6.5 (GLOVE) ×2 IMPLANT
GLOVE BIOGEL PI IND STRL 7.5 (GLOVE) ×3 IMPLANT
GLOVE BIOGEL PI INDICATOR 6.5 (GLOVE) ×4
GLOVE BIOGEL PI INDICATOR 7.5 (GLOVE) ×6
GOWN STRL REUS W/ TWL XL LVL3 (GOWN DISPOSABLE) ×2 IMPLANT
GOWN STRL REUS W/TWL XL LVL3 (GOWN DISPOSABLE) ×12 IMPLANT
IV LACTATED RINGER IRRG 3000ML (IV SOLUTION) ×3
IV LR IRRIG 3000ML ARTHROMATIC (IV SOLUTION) ×1 IMPLANT
KII OPTICAL ACCESS SYSTEM 5X100 IMPLANT
KIT BASIN OR (CUSTOM PROCEDURE TRAY) ×3 IMPLANT
LIQUID BAND (GAUZE/BANDAGES/DRESSINGS) ×3 IMPLANT
MARKER SKIN DUAL TIP RULER LAB (MISCELLANEOUS) ×3 IMPLANT
MESH PARIETEX 4.7 (Mesh General) ×3 IMPLANT
NS IRRIG 500ML POUR BTL (IV SOLUTION) ×3 IMPLANT
PENCIL BUTTON HOLSTER BLD 10FT (ELECTRODE) ×3 IMPLANT
RELIATACK ARTICULATING RELOADABLE FIXATION DEVICE IMPLANT
SCISSORS LAP 5X35 DISP (ENDOMECHANICALS) ×3 IMPLANT
SOLUTION ANTI FOG 6CC (MISCELLANEOUS) ×3 IMPLANT
STORZ HIGH FREQUENCY MONOPOLAR CABLE IMPLANT
SUT NOVA 0 T19/GS 22DT (SUTURE) ×6 IMPLANT
SUT VIC AB 4-0 SH 18 (SUTURE) ×3 IMPLANT
SUT VICRYL 0 TIES 12 18 (SUTURE) ×3 IMPLANT
TOWEL OR 17X26 10 PK STRL BLUE (TOWEL DISPOSABLE) ×3 IMPLANT
TOWEL OR NON WOVEN STRL DISP B (DISPOSABLE) ×3 IMPLANT
TRAY LAPAROSCOPIC (CUSTOM PROCEDURE TRAY) ×3 IMPLANT
TROCAR ADV FIXATION 5X100MM (TROCAR) ×3 IMPLANT
TROCAR BLADELESS OPT 5 100 (ENDOMECHANICALS) ×3 IMPLANT
TROCAR OPTI TIP 5M 100M (ENDOMECHANICALS) ×3 IMPLANT

## 2014-05-07 NOTE — Discharge Instructions (Signed)
Umbilical Herniorrhaphy Herniorrhaphy is surgery to repair a hernia. A hernia is the protrusion of a part of an organ through an abdominal opening. An umbilical hernia means that your hernia is in the area around your navel. If the hernia is not repaired, the gap could get bigger. Your intestines or other tissues, such as fat, could get trapped in the gap. This can lead to other health problems, such as blocked intestines. If the hernia is fixed before problems set in, you may be allowed to go home the same day as the surgery (outpatient). LET [facility] CARE PROVIDER KNOW ABOUT: 1. Allergies to food or medicine. 2. Medicines taken, including vitamins, herbs, eye drops, over-the-counter medicines, and creams. 3. Use of steroids (by mouth or creams). 4. Previous problems with anesthetics or numbing medicines. 5. History of bleeding problems or blood clots. 6. Previous surgery. 7. Other health problems, including diabetes and kidney problems. 8. Possibility of pregnancy, if this applies. RISKS AND COMPLICATIONS 1. Pain. 2. Excessive bleeding. 3. Hematoma. This is a pocket of blood that collects under the surgery site. 4. Infection at the surgery site. 5. Numbness at the surgery site. 6. Swelling and bruising. 7. Blood clots. 8. Intestinal damage (rare). 9. Scarring. 10. Skin damage. 11. Development of another hernia. This may require another surgery. BEFORE THE PROCEDURE 1. Ask your health care provider about changing or stopping your regular medicines. You may need to stop taking aspirin, nonsteroidal anti-inflammatory drugs (NSAIDs), vitamin E, and blood thinners as early as 2 weeks before the procedure. 2. Do not eat or drink for 8 hours before the procedure, or as directed by your health care provider. 3. You might be asked to shower or wash with an antibacterial soap before the procedure. 4. Wear comfortable clothes that will be easy to put on after the procedure. PROCEDURE You will  be given an intravenous (IV) tube. A needle will be inserted in your arm. Medicine will flow directly into your body through this needle. You might be given medicine to help you relax (sedative). You will be given medicine that numbs the area (local anesthetic) or medicine that makes you sleep (general anesthetic). If you have open surgery: 1. The surgeon will make a cut (incision) in your abdomen. 2. The gap in the muscle wall will be repaired. The surgeon may sew the edges together over the gap or use a mesh material to strengthen the area. When mesh is used, the body grows new, strong tissue into and around it. This new tissue closes the gap. 3. A drain might be put in to remove excess fluid from the body after surgery. 4. The surgeon will close the incision with stitches, glue, or staples. If you have laparoscopic surgery:  The surgeon will make several small incisions in your abdomen.  A thin, lighted tube (laparoscope) will be inserted into the abdomen through an incision. A camera is attached to the laparoscope that allows the surgeon to see inside the abdomen.  Tools will be inserted through the other incisions to repair the hernia. Usually, mesh is used to cover the gap.  The surgeon will close the incisions with stitches. AFTER THE PROCEDURE  You will be taken to a recovery area. A nurse will watch and check your progress.  When you are awake, feeling well, and taking fluids well, you may be allowed to go home. In some cases, you may need to stay overnight in the hospital.  Arrange for someone to drive you home.  Document Released: 05/07/2008 Document Revised: 06/25/2013 Document Reviewed: 05/12/2011 [facility] Patient Information 2015 [place]. This information is not intended to replace advice given to you by your health care provider. Make sure you discuss any questions you have with your health care provider.   Wear abdominal binder to prevent back pain and to support  hernia repair Don't drive if you are taking the narcotics  Information for Discharge Teaching: EXPAREL (bupivacaine liposome injectable suspension)   Your surgeon gave you EXPAREL(bupivacaine) in your surgical incision to help control your pain after surgery.   EXPAREL is a local anesthetic that provides pain relief by numbing the tissue around the surgical site.  EXPAREL is designed to release pain medication over time and can control pain for up to 72 hours.  Depending on how you respond to EXPAREL, you may require less pain medication during your recovery.  Possible side effects:  Temporary loss of sensation or ability to move in the area where bupivacaine was injected.  Nausea, vomiting, constipation  Rarely, numbness and tingling in your mouth or lips, lightheadedness, or anxiety may occur.  Call your doctor right away if you think you may be experiencing any of these sensations, or if you have other questions regarding possible side effects.  Follow all other discharge instructions given to you by your surgeon or nurse. Eat a healthy diet and drink plenty of water or other fluids.  If you return to the hospital for any reason within 96 hours following the administration of EXPAREL, please inform your health care providers.   Post Anesthesia Home Care Instructions  Activity: Get plenty of rest for the remainder of the day. A responsible adult should stay with you for 24 hours following the procedure.  For the next 24 hours, DO NOT: -Drive a car -Advertising copywriterperate machinery -Drink alcoholic beverages -Take any medication unless instructed by your physician -Make any legal decisions or sign important papers.  Meals: Start with liquid foods such as gelatin or soup. Progress to regular foods as tolerated. Avoid greasy, spicy, heavy foods. If nausea and/or vomiting occur, drink only clear liquids until the nausea and/or vomiting subsides. Call your physician if vomiting  continues.  Special Instructions/Symptoms: Your throat may feel dry or sore from the anesthesia or the breathing tube placed in your throat during surgery. If this causes discomfort, gargle with warm salt water. The discomfort should disappear within 24 hours.

## 2014-05-07 NOTE — Anesthesia Preprocedure Evaluation (Signed)
Anesthesia Evaluation  Patient identified by MRN, date of birth, ID band Patient awake    Reviewed: Allergy & Precautions, NPO status , Patient's Chart, lab work & pertinent test results  Airway Mallampati: II  TM Distance: >3 FB Neck ROM: Full    Dental no notable dental hx.    Pulmonary neg pulmonary ROS,  breath sounds clear to auscultation  Pulmonary exam normal       Cardiovascular negative cardio ROS  Rhythm:Regular Rate:Normal     Neuro/Psych negative neurological ROS  negative psych ROS   GI/Hepatic negative GI ROS, Neg liver ROS,   Endo/Other  negative endocrine ROS  Renal/GU negative Renal ROS  negative genitourinary   Musculoskeletal negative musculoskeletal ROS (+)   Abdominal   Peds negative pediatric ROS (+)  Hematology negative hematology ROS (+)   Anesthesia Other Findings   Reproductive/Obstetrics negative OB ROS                             Anesthesia Physical Anesthesia Plan  ASA: I  Anesthesia Plan: General   Post-op Pain Management:    Induction: Intravenous  Airway Management Planned: Oral ETT  Additional Equipment:   Intra-op Plan:   Post-operative Plan: Extubation in OR  Informed Consent: I have reviewed the patients History and Physical, chart, labs and discussed the procedure including the risks, benefits and alternatives for the proposed anesthesia with the patient or authorized representative who has indicated his/her understanding and acceptance.   Dental advisory given  Plan Discussed with: CRNA and Surgeon  Anesthesia Plan Comments:         Anesthesia Quick Evaluation  

## 2014-05-07 NOTE — Anesthesia Postprocedure Evaluation (Deleted)
Anesthesia Post Note  Patient: Charles Moran  Procedure(s) Performed: Procedure(s) (LRB): LAPAROSCOPIC UMBILICAL HERNIA REPAIR WITH MESH (N/A)  Anesthesia type: general  Patient location: PACU  Post pain: Pain level controlled  Post assessment: Patient's Cardiovascular Status Stable  Last Vitals:  Filed Vitals:   05/07/14 1600  BP: 163/92  Pulse: 66  Temp:   Resp: 23    Post vital signs: Reviewed and stable  Level of consciousness: sedated  Complications: No apparent anesthesia complications

## 2014-05-07 NOTE — Anesthesia Postprocedure Evaluation (Signed)
  Immediate Anesthesia Transfer of Care Note  Patient: Charles Moran  Procedure(s) Performed: Procedure(s) (LRB): LAPAROSCOPIC UMBILICAL HERNIA REPAIR WITH MESH (N/A)  Patient Location: PACU  Anesthesia Type: General  Level of Consciousness: awake, sedated, patient cooperative and responds to stimulation  Airway & Oxygen Therapy: Patient Spontanous Breathing and Patient connected to face mask oxygen  Post-op Assessment: Report given to PACU RN, Post -op Vital signs reviewed and stable and Patient moving all extremities  Post vital signs: Reviewed and stable  Complications: No apparent anesthesia complications

## 2014-05-07 NOTE — Op Note (Signed)
Surgeon: Wenda LowMatt Leani Myron, MD, FACS  Asst:  none  [facility]  Procedure: Laparoscopic assisted umbilical hernia repair with 8 cm Parietex mesh buttressing a vest over pants repair of the 2 cm defect  Diagnosis: Umbilical hernia  Complications: none  EBL:   minimal cc  Drains: none  Description of Procedure:  [facility]  After anesthesia was administered and the patient was prepped a timeout was performed.  Access to then abdomen was achieved with a 5 mm Optiview through the left upper quadrant.  A second port was placed on the left side.  I removed omentum that was incarcerated within the hernia.  I measured the hernia to be 2 cm in diameter.  The mesh with 4 anchoring sutures was placed into the abdomen and the defect closed with 0 Novafil in a vest over pants technique.  The laparoscopic approach was then used to retrieve the sutures with the Endoclose and pull the mesh up to the abdominal wall.  The mesh had the protective coating facing the bowel.  The mesh was then tacked with an absorbable tacker.  The wounds were injected with Exparel and closed with 4-0 vicryl and Dermabond.  The abdomen was surveyed before closing and the liver and GB looked ok.  There was a large omental apron.    The patient tolerated the procedure well and was taken to the PACU in stable condition.  He will be discharged home.     Charles B. Daphine DeutscherMartin, MD, [facility] [place] [phone]

## 2014-05-07 NOTE — Transfer of Care (Signed)
Immediate Anesthesia Transfer of Care Note  Patient: Charles Moran  Procedure(s) Performed: Procedure(s) (LRB): LAPAROSCOPIC UMBILICAL HERNIA REPAIR (N/A)  Patient Location: PACU  Anesthesia Type: General  Level of Consciousness: awake, sedated, patient cooperative and responds to stimulation  Airway & Oxygen Therapy: Patient Spontanous Breathing and Patient connected to face mask oxygen  Post-op Assessment: Report given to PACU RN, Post -op Vital signs reviewed and stable and Patient moving all extremities  Post vital signs: Reviewed and stable  Complications: No apparent anesthesia complications

## 2014-05-07 NOTE — Anesthesia Procedure Notes (Signed)
Procedure Name: Intubation Date/Time: 05/07/2014 1:45 PM Performed by: Tyrone NineSAUVE, Ketrina Boateng F Pre-anesthesia Checklist: Patient identified, Timeout performed, Emergency Drugs available, Suction available and Patient being monitored Patient Re-evaluated:Patient Re-evaluated prior to inductionOxygen Delivery Method: Circle system utilized Preoxygenation: Pre-oxygenation with 100% oxygen Intubation Type: IV induction Ventilation: Mask ventilation without difficulty Laryngoscope Size: Mac and 3 Grade View: Grade II Tube type: Oral Number of attempts: 1 Airway Equipment and Method: Stylet and Bite block Placement Confirmation: ETT inserted through vocal cords under direct vision,  breath sounds checked- equal and bilateral and positive ETCO2 Secured at: 22 cm Tube secured with: Tape Dental Injury: Teeth and Oropharynx as per pre-operative assessment

## 2014-05-07 NOTE — Anesthesia Postprocedure Evaluation (Signed)
Anesthesia Post Note  Patient: Charles Moran  Procedure(s) Performed: Procedure(s) (LRB): LAPAROSCOPIC UMBILICAL HERNIA REPAIR WITH MESH (N/A)  Anesthesia type: general  Patient location: PACU  Post pain: Pain level controlled  Post assessment: Patient's Cardiovascular Status Stable  Last Vitals:  Filed Vitals:   05/07/14 1600  BP: 163/92  Pulse: 66  Temp:   Resp: 23    Post vital signs: Reviewed and stable  Level of consciousness: sedated  Complications: No apparent anesthesia complications 

## 2014-05-07 NOTE — Interval H&P Note (Signed)
History and Physical Interval Note:  05/07/2014 1:31 PM  Zachary GeorgeLarry Hewlett  has presented today for surgery, with the diagnosis of UMBILICAL HERNIA  The various methods of treatment have been discussed with the patient and family. After consideration of risks, benefits and other options for treatment, the patient has consented to  Procedure(s): LAPAROSCOPIC UMBILICAL HERNIA REPAIR (N/A) as a surgical intervention .  The patient's history has been reviewed, patient examined, no change in status, stable for surgery.  I have reviewed the patient's chart and labs.  Questions were answered to the patient's satisfaction.     Brandyn Lowrey B

## 2014-05-09 ENCOUNTER — Encounter (HOSPITAL_BASED_OUTPATIENT_CLINIC_OR_DEPARTMENT_OTHER): Payer: Self-pay | Admitting: Surgery

## 2015-12-22 ENCOUNTER — Other Ambulatory Visit: Payer: Self-pay | Admitting: Orthopedic Surgery

## 2015-12-22 DIAGNOSIS — M25562 Pain in left knee: Secondary | ICD-10-CM

## 2015-12-26 ENCOUNTER — Ambulatory Visit
Admission: RE | Admit: 2015-12-26 | Discharge: 2015-12-26 | Disposition: A | Discharge status: Home / Self Care | Payer: 59 | Source: Ambulatory Visit | Attending: Orthopedic Surgery | Admitting: Orthopedic Surgery

## 2015-12-26 DIAGNOSIS — M25562 Pain in left knee: Secondary | ICD-10-CM

## 2017-04-04 ENCOUNTER — Other Ambulatory Visit: Payer: Self-pay | Admitting: Urology

## 2017-04-04 DIAGNOSIS — R972 Elevated prostate specific antigen [PSA]: Secondary | ICD-10-CM

## 2017-04-29 ENCOUNTER — Ambulatory Visit
Admission: RE | Admit: 2017-04-29 | Discharge: 2017-04-29 | Disposition: A | Discharge status: Home / Self Care | Payer: 59 | Source: Ambulatory Visit | Attending: Urology | Admitting: Urology

## 2017-04-29 DIAGNOSIS — R972 Elevated prostate specific antigen [PSA]: Secondary | ICD-10-CM

## 2017-04-29 MED ORDER — GADOBENATE DIMEGLUMINE 529 MG/ML IV SOLN
20.0000 mL | Freq: Once | INTRAVENOUS | Status: AC | PRN
  Administered 2017-04-29: 20 mL via INTRAVENOUS

## 2021-05-25 ENCOUNTER — Other Ambulatory Visit: Payer: Self-pay | Admitting: Otolaryngology

## 2021-05-25 DIAGNOSIS — R1313 Dysphagia, pharyngeal phase: Secondary | ICD-10-CM

## 2021-06-02 ENCOUNTER — Ambulatory Visit
Admission: RE | Admit: 2021-06-02 | Discharge: 2021-06-02 | Disposition: A | Discharge status: Home / Self Care | Payer: 59 | Source: Ambulatory Visit | Attending: Otolaryngology | Admitting: Otolaryngology

## 2021-06-02 DIAGNOSIS — R1313 Dysphagia, pharyngeal phase: Secondary | ICD-10-CM

## 2022-06-14 IMAGING — RF DG ESOPHAGUS
8 series · 14 of 24 positions shown · non-contrast
Comparison: None.

CLINICAL DATA: Globus sensation.

EXAM:
ESOPHOGRAM / BARIUM SWALLOW / BARIUM TABLET STUDY
TECHNIQUE: Combined double contrast and single contrast examination performed
using effervescent crystals, thick barium liquid, and thin barium
liquid. The patient was observed with fluoroscopy swallowing a 13 mm
barium sulphate tablet.
FLUOROSCOPY:
Radiation Exposure Index (as provided by the fluoroscopic device):
48 mGy Kerma

[Series 1: sequence · 0.28mm/px · 2 of 12 frames shown (1 of 7)]
[frame 2/12]
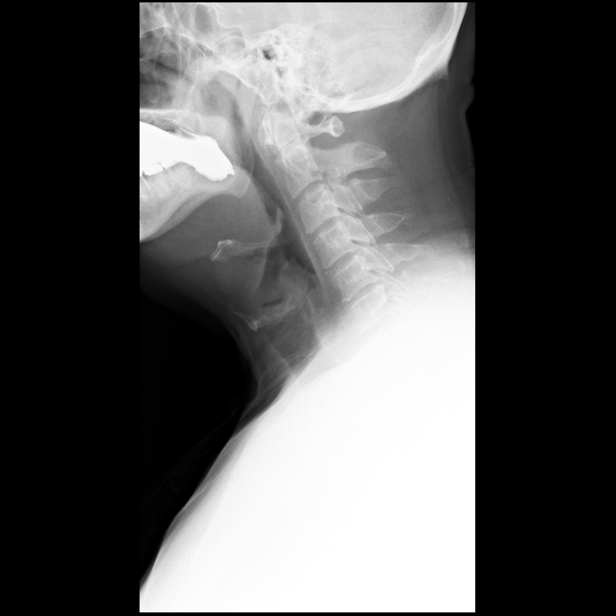
[frame 10/12]
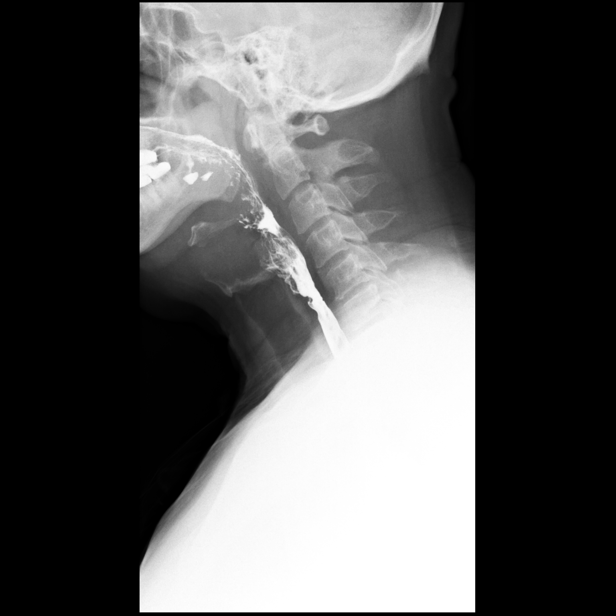

[Series 2: sequence · 0.28mm/px · 1 of 16 frames shown (2 of 7)]
[frame 9/16]
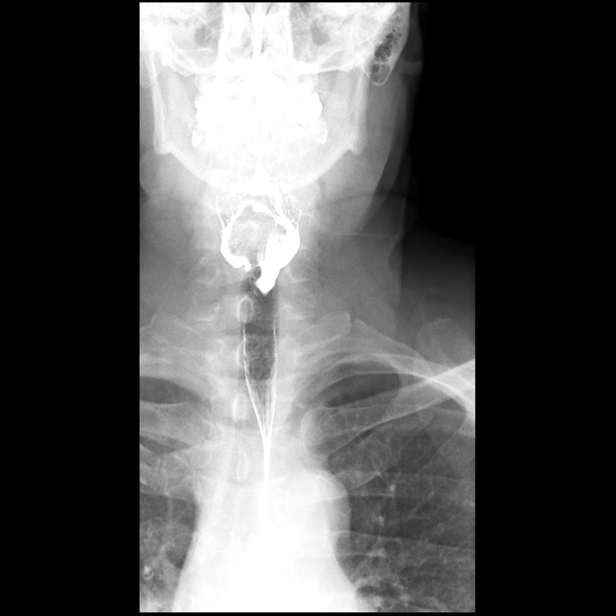

[Series 3: sequence · 0.28mm/px · 2 of 40 frames shown (3 of 7)]
[frame 7/40]
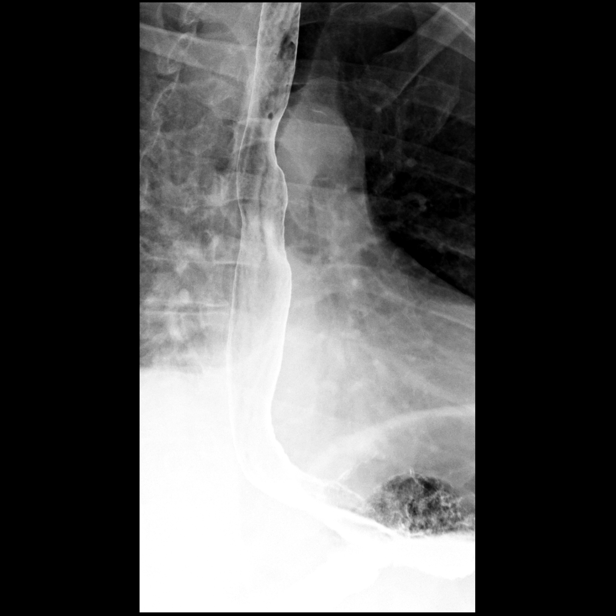
[frame 21/40]
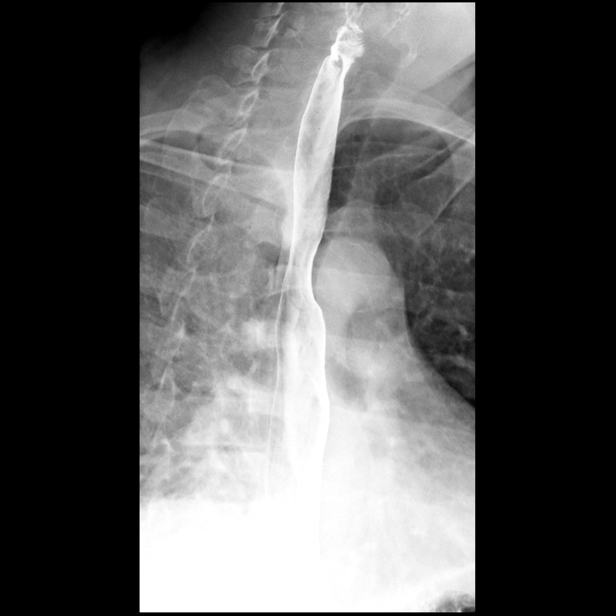

[Series 4: sequence · 3 of 47 frames shown (4 of 7)]
[frame 8/47]
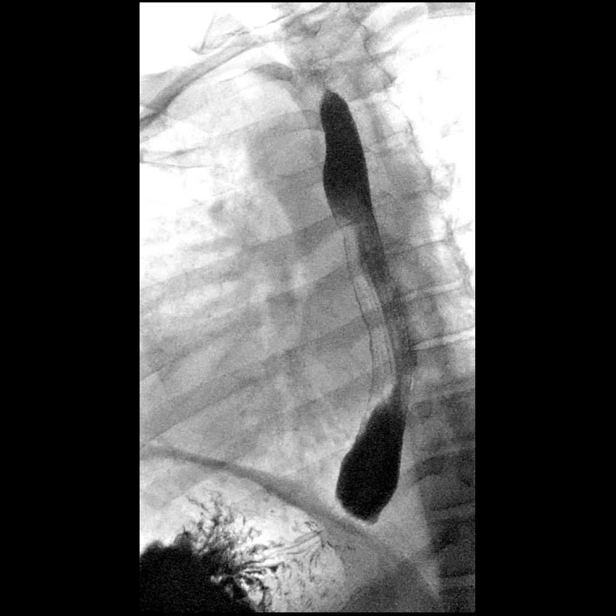
[frame 28/47]
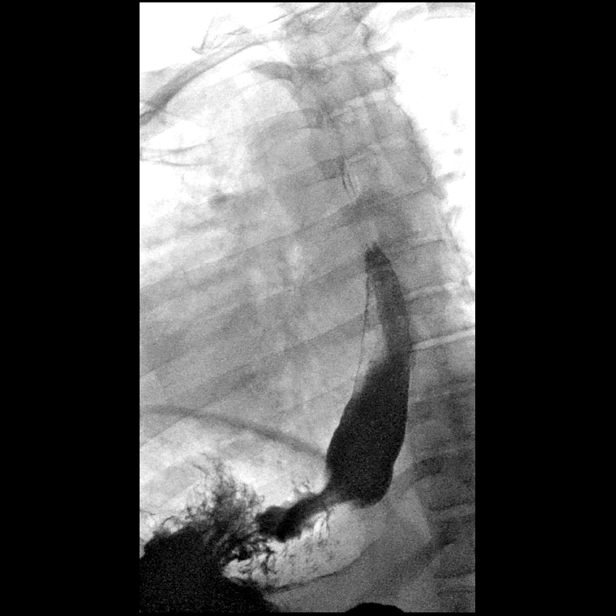
[frame 40/47]
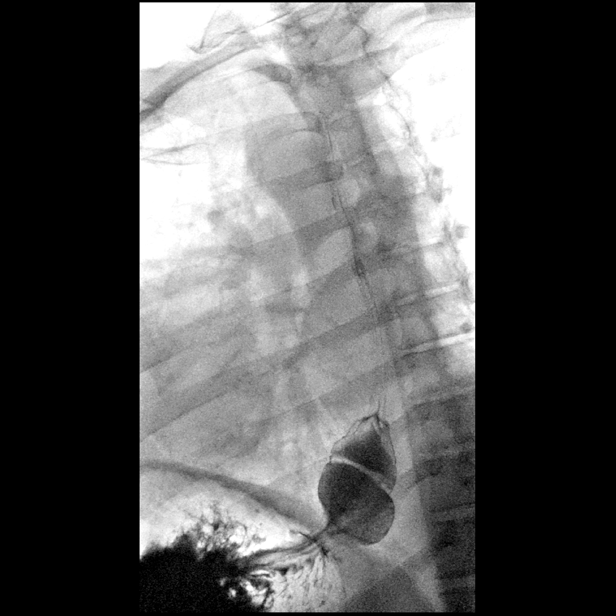

[Series 5: sequence · 1 of 34 frames shown (5 of 7)]
[frame 6/34]
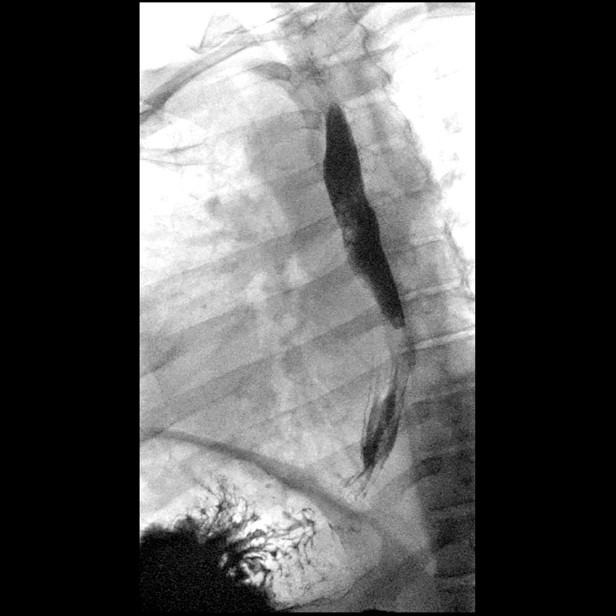

[Series 6: sequence · 3 of 36 frames shown (6 of 7)]
[frame 6/36]
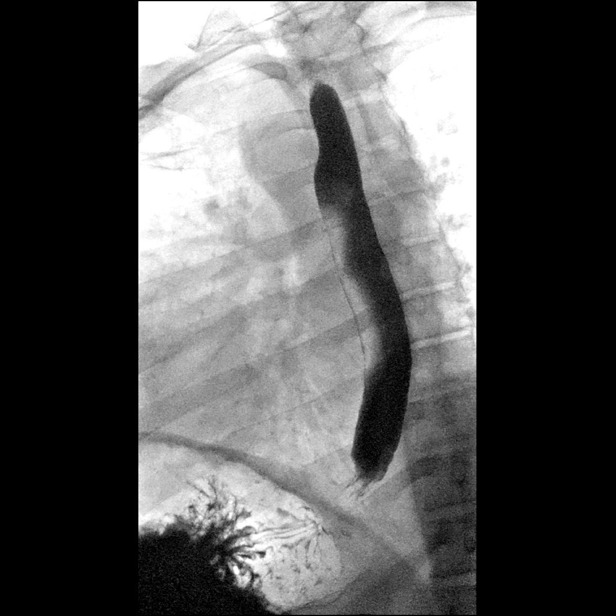
[frame 31/36]
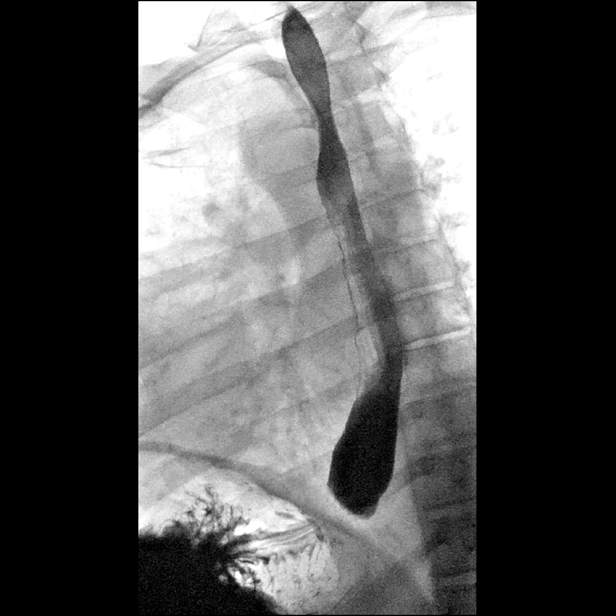
[frame 36/36]
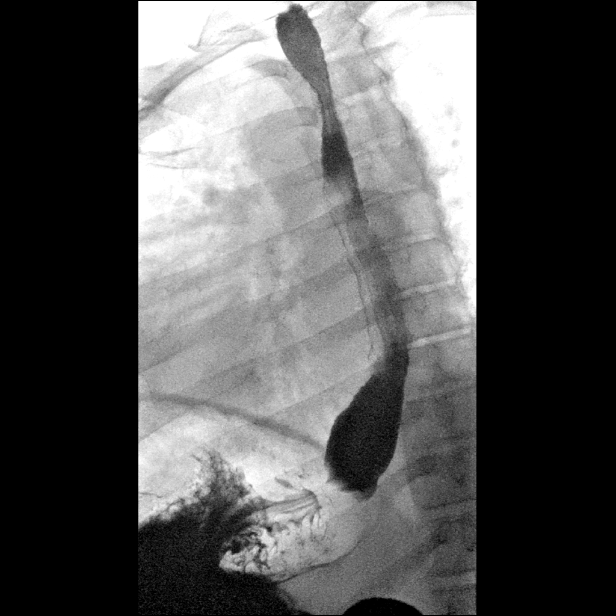

[Series 7: sequence · 1 of 49 frames shown (7 of 7)]
[frame 42/49]
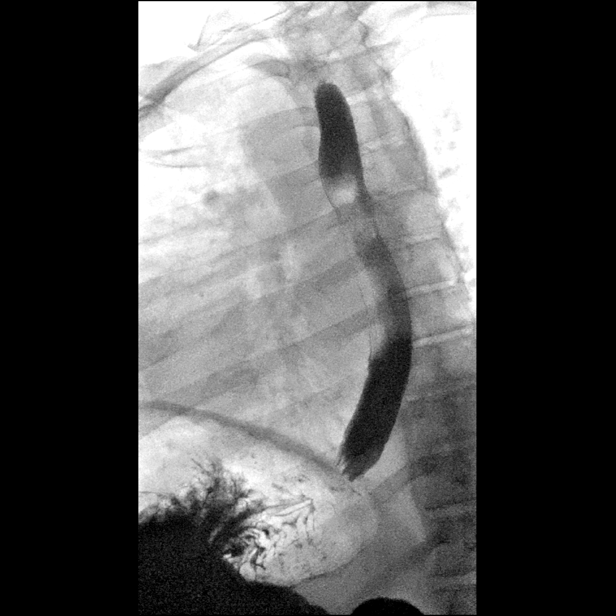

[Series 8: one shot · 1 of 1 slices shown]
[im 1/1]
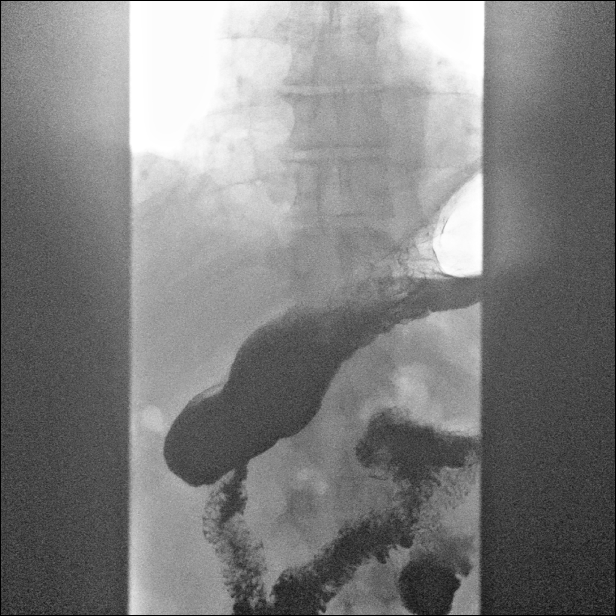

[14 of 24 positions shown; findings below may reference images not displayed]

FINDINGS: Frontal and lateral views of the hypopharynx while swallowing thick
barium are unremarkable.

Double contrast imaging of the esophagus shows no evidence for
diverticulum, stricture, mass lesion, or mucosal ulceration.

Patient was placed RAO prone to assess esophageal motility. In this
position, small sliding type hiatal hernia is expressed with
nonobstructive Schatzki ring. Nonspecific esophageal motility
disorder evident with disruption of primary peristalsis in the upper
third of the esophagus on multiple swallows. No overt tertiary
contractions or evidence of presbyesophagus.

13 mm barium tablet passes readily into the stomach when taken with
water.
IMPRESSION: 1. Nonspecific esophageal motility disorder.
2. Small sliding type hiatal hernia with a nonobstructive Schatzki
ring.
3. Otherwise unremarkable double contrast barium esophagram. No
findings to explain the patient's globus sensation.

## 2022-10-07 ENCOUNTER — Other Ambulatory Visit: Payer: Self-pay | Admitting: Urology

## 2022-10-07 DIAGNOSIS — R972 Elevated prostate specific antigen [PSA]: Secondary | ICD-10-CM

## 2022-12-08 ENCOUNTER — Ambulatory Visit
Admission: RE | Admit: 2022-12-08 | Discharge: 2022-12-08 | Disposition: A | Discharge status: Home / Self Care | Payer: 59 | Source: Ambulatory Visit | Attending: Urology | Admitting: Urology

## 2022-12-08 DIAGNOSIS — R972 Elevated prostate specific antigen [PSA]: Secondary | ICD-10-CM

## 2022-12-08 MED ORDER — GADOPICLENOL 0.5 MMOL/ML IV SOLN
10.0000 mL | Freq: Once | INTRAVENOUS | Status: AC | PRN
  Administered 2022-12-08: 10 mL via INTRAVENOUS

## 2024-01-09 ENCOUNTER — Other Ambulatory Visit: Payer: Self-pay

## 2024-01-09 ENCOUNTER — Other Ambulatory Visit (HOSPITAL_BASED_OUTPATIENT_CLINIC_OR_DEPARTMENT_OTHER): Payer: Self-pay

## 2024-01-09 MED ORDER — NORTRIPTYLINE HCL 25 MG PO CAPS: 50.0000 mg | ORAL_CAPSULE | Freq: Every day | ORAL | 2 refills | Status: AC

## 2024-01-09 MED ORDER — GABAPENTIN 300 MG PO CAPS: 600.0000 mg | ORAL_CAPSULE | Freq: Three times a day (TID) | ORAL | 3 refills | Status: AC

## 2024-01-09 MED ORDER — ROSUVASTATIN CALCIUM 10 MG PO TABS
10.0000 mg | ORAL_TABLET | Freq: Every day | ORAL | 3 refills | Status: AC
  Filled 2024-01-09 – 2024-02-16 (×2): qty 90, 90d supply, fill #0

## 2024-01-09 MED ORDER — LISINOPRIL 20 MG PO TABS
20.0000 mg | ORAL_TABLET | Freq: Every day | ORAL | 3 refills | Status: AC
  Filled 2024-02-16: qty 90, 90d supply, fill #0

## 2024-01-09 MED ORDER — NORTRIPTYLINE HCL 25 MG PO CAPS
50.0000 mg | ORAL_CAPSULE | Freq: Every day | ORAL | 2 refills | Status: AC
  Filled 2024-01-09: qty 60, 30d supply, fill #0

## 2024-01-09 MED ORDER — GABAPENTIN 300 MG PO CAPS
600.0000 mg | ORAL_CAPSULE | Freq: Three times a day (TID) | ORAL | 3 refills | Status: AC
  Filled 2024-01-09: qty 180, 30d supply, fill #0

## 2024-01-09 MED ORDER — VALACYCLOVIR HCL 1 G PO TABS: 1000.0000 mg | ORAL_TABLET | Freq: Two times a day (BID) | ORAL | 3 refills | Status: AC

## 2024-01-25 ENCOUNTER — Other Ambulatory Visit (HOSPITAL_BASED_OUTPATIENT_CLINIC_OR_DEPARTMENT_OTHER): Payer: Self-pay

## 2024-01-25 ENCOUNTER — Other Ambulatory Visit: Payer: Self-pay

## 2024-01-25 MED ORDER — GABAPENTIN 800 MG PO TABS
800.0000 mg | ORAL_TABLET | Freq: Three times a day (TID) | ORAL | 3 refills | Status: AC
  Filled 2024-01-25: qty 90, 30d supply, fill #0
  Filled 2024-03-02: qty 90, 30d supply, fill #1

## 2024-01-25 MED ORDER — LIDOCAINE 5 % EX PTCH
1.0000 | MEDICATED_PATCH | Freq: Two times a day (BID) | CUTANEOUS | 1 refills | Status: AC
  Filled 2024-01-25: qty 90, 30d supply, fill #0
  Filled 2024-02-16: qty 90, 15d supply, fill #0

## 2024-01-25 MED ORDER — OXYCODONE-ACETAMINOPHEN 10-325 MG PO TABS
1.0000 | ORAL_TABLET | Freq: Four times a day (QID) | ORAL | 0 refills | Status: AC | PRN
  Filled 2024-02-06: qty 120, 30d supply, fill #0

## 2024-01-25 MED ORDER — OXYCODONE-ACETAMINOPHEN 10-325 MG PO TABS
1.0000 | ORAL_TABLET | Freq: Four times a day (QID) | ORAL | 0 refills | Status: AC | PRN
  Filled 2024-03-06: qty 120, 30d supply, fill #0
  Filled ????-??-??: fill #0

## 2024-01-25 MED ORDER — NORTRIPTYLINE HCL 50 MG PO CAPS
50.0000 mg | ORAL_CAPSULE | Freq: Every day | ORAL | 2 refills | Status: AC
  Filled 2024-01-25: qty 30, 30d supply, fill #0
  Filled 2024-03-05: qty 30, 30d supply, fill #1

## 2024-01-31 ENCOUNTER — Other Ambulatory Visit (HOSPITAL_BASED_OUTPATIENT_CLINIC_OR_DEPARTMENT_OTHER): Payer: Self-pay

## 2024-02-06 ENCOUNTER — Other Ambulatory Visit (HOSPITAL_BASED_OUTPATIENT_CLINIC_OR_DEPARTMENT_OTHER): Payer: Self-pay

## 2024-02-17 ENCOUNTER — Other Ambulatory Visit (HOSPITAL_BASED_OUTPATIENT_CLINIC_OR_DEPARTMENT_OTHER): Payer: Self-pay

## 2024-03-06 ENCOUNTER — Other Ambulatory Visit (HOSPITAL_BASED_OUTPATIENT_CLINIC_OR_DEPARTMENT_OTHER): Payer: Self-pay

## 2024-03-06 ENCOUNTER — Other Ambulatory Visit: Payer: Self-pay

## 2024-03-07 ENCOUNTER — Other Ambulatory Visit (HOSPITAL_BASED_OUTPATIENT_CLINIC_OR_DEPARTMENT_OTHER): Payer: Self-pay

## 2024-03-08 ENCOUNTER — Other Ambulatory Visit (HOSPITAL_BASED_OUTPATIENT_CLINIC_OR_DEPARTMENT_OTHER): Payer: Self-pay

## 2024-03-28 ENCOUNTER — Other Ambulatory Visit (HOSPITAL_BASED_OUTPATIENT_CLINIC_OR_DEPARTMENT_OTHER): Payer: Self-pay

## 2024-03-28 ENCOUNTER — Other Ambulatory Visit: Payer: Self-pay

## 2024-03-28 MED ORDER — LIDOCAINE 5 % EX PTCH
1.0000 | MEDICATED_PATCH | Freq: Every day | CUTANEOUS | 1 refills | Status: AC
  Filled 2024-03-28: qty 90, 30d supply, fill #0

## 2024-03-28 MED ORDER — CELECOXIB 200 MG PO CAPS
200.0000 mg | ORAL_CAPSULE | Freq: Every day | ORAL | 2 refills | Status: AC | PRN
  Filled 2024-03-28: qty 30, 30d supply, fill #0

## 2024-03-28 MED ORDER — NORTRIPTYLINE HCL 50 MG PO CAPS
50.0000 mg | ORAL_CAPSULE | Freq: Every day | ORAL | 2 refills | Status: AC
  Filled 2024-03-28: qty 30, 30d supply, fill #0

## 2024-03-28 MED ORDER — TIZANIDINE HCL 4 MG PO TABS
4.0000 mg | ORAL_TABLET | Freq: Every evening | ORAL | 2 refills | Status: AC | PRN
  Filled 2024-03-28: qty 30, 30d supply, fill #0

## 2024-03-28 MED ORDER — OXYCODONE-ACETAMINOPHEN 10-325 MG PO TABS: 1.0000 | ORAL_TABLET | Freq: Four times a day (QID) | ORAL | 0 refills | Status: AC | PRN

## 2024-03-28 MED ORDER — GABAPENTIN 800 MG PO TABS
800.0000 mg | ORAL_TABLET | Freq: Three times a day (TID) | ORAL | 3 refills | Status: AC
  Filled 2024-03-28: qty 90, 30d supply, fill #0

## 2024-03-30 ENCOUNTER — Other Ambulatory Visit (HOSPITAL_BASED_OUTPATIENT_CLINIC_OR_DEPARTMENT_OTHER): Payer: Self-pay
